# Patient Record
Sex: Male | Born: 1985 | Race: Black or African American | Hispanic: No | Marital: Single | Smoking: Never smoker
Health system: Southern US, Community
[De-identification: ages and names within clinical notes are randomized; demographics above are authoritative.]

## PROBLEM LIST (undated history)

## (undated) DIAGNOSIS — Z87828 Personal history of other (healed) physical injury and trauma: Secondary | ICD-10-CM

---

## 2016-01-08 ENCOUNTER — Emergency Department (HOSPITAL_COMMUNITY): Payer: Self-pay

## 2016-01-08 ENCOUNTER — Emergency Department: Payer: Self-pay

## 2016-01-08 ENCOUNTER — Emergency Department
Admission: EM | Admit: 2016-01-08 | Discharge: 2016-01-08 | Payer: Self-pay | Attending: Emergency Medicine | Admitting: Emergency Medicine

## 2016-01-08 ENCOUNTER — Emergency Department (HOSPITAL_COMMUNITY)
Admission: EM | Admit: 2016-01-08 | Discharge: 2016-01-09 | Disposition: A | Discharge status: Home / Self Care | Payer: Self-pay | Attending: Emergency Medicine | Admitting: Emergency Medicine

## 2016-01-08 ENCOUNTER — Encounter: Payer: Self-pay | Admitting: Emergency Medicine

## 2016-01-08 ENCOUNTER — Encounter (HOSPITAL_COMMUNITY): Payer: Self-pay | Admitting: Emergency Medicine

## 2016-01-08 DIAGNOSIS — Y999 Unspecified external cause status: Secondary | ICD-10-CM | POA: Insufficient documentation

## 2016-01-08 DIAGNOSIS — S91331A Puncture wound without foreign body, right foot, initial encounter: Secondary | ICD-10-CM | POA: Insufficient documentation

## 2016-01-08 DIAGNOSIS — S32501A Unspecified fracture of right pubis, initial encounter for closed fracture: Secondary | ICD-10-CM | POA: Insufficient documentation

## 2016-01-08 DIAGNOSIS — Y939 Activity, unspecified: Secondary | ICD-10-CM | POA: Insufficient documentation

## 2016-01-08 DIAGNOSIS — S80211A Abrasion, right knee, initial encounter: Secondary | ICD-10-CM | POA: Insufficient documentation

## 2016-01-08 DIAGNOSIS — S71101A Unspecified open wound, right thigh, initial encounter: Secondary | ICD-10-CM | POA: Insufficient documentation

## 2016-01-08 DIAGNOSIS — Y929 Unspecified place or not applicable: Secondary | ICD-10-CM | POA: Insufficient documentation

## 2016-01-08 DIAGNOSIS — S60416A Abrasion of right little finger, initial encounter: Secondary | ICD-10-CM | POA: Insufficient documentation

## 2016-01-08 DIAGNOSIS — S91301A Unspecified open wound, right foot, initial encounter: Secondary | ICD-10-CM | POA: Insufficient documentation

## 2016-01-08 DIAGNOSIS — W3400XA Accidental discharge from unspecified firearms or gun, initial encounter: Secondary | ICD-10-CM | POA: Insufficient documentation

## 2016-01-08 DIAGNOSIS — S60311A Abrasion of right thumb, initial encounter: Secondary | ICD-10-CM | POA: Insufficient documentation

## 2016-01-08 DIAGNOSIS — S6991XA Unspecified injury of right wrist, hand and finger(s), initial encounter: Secondary | ICD-10-CM

## 2016-01-08 DIAGNOSIS — Y9302 Activity, running: Secondary | ICD-10-CM | POA: Insufficient documentation

## 2016-01-08 DIAGNOSIS — S32591A Other specified fracture of right pubis, initial encounter for closed fracture: Secondary | ICD-10-CM

## 2016-01-08 DIAGNOSIS — S61304A Unspecified open wound of right ring finger with damage to nail, initial encounter: Secondary | ICD-10-CM | POA: Insufficient documentation

## 2016-01-08 DIAGNOSIS — S32591B Other specified fracture of right pubis, initial encounter for open fracture: Secondary | ICD-10-CM

## 2016-01-08 DIAGNOSIS — S71131A Puncture wound without foreign body, right thigh, initial encounter: Secondary | ICD-10-CM

## 2016-01-08 HISTORY — DX: Personal history of other (healed) physical injury and trauma: Z87.828

## 2016-01-08 LAB — BASIC METABOLIC PANEL
ANION GAP: 9 (ref 5–15)
BUN: 14 mg/dL (ref 6–20)
CALCIUM: 9.2 mg/dL (ref 8.9–10.3)
CO2: 26 mmol/L (ref 22–32)
CREATININE: 1.28 mg/dL — AB (ref 0.61–1.24)
Chloride: 102 mmol/L (ref 101–111)
GLUCOSE: 147 mg/dL — AB (ref 65–99)
Potassium: 3.3 mmol/L — ABNORMAL LOW (ref 3.5–5.1)
Sodium: 137 mmol/L (ref 135–145)

## 2016-01-08 LAB — CBC WITH DIFFERENTIAL/PLATELET
BASOS ABS: 0.1 10*3/uL (ref 0–0.1)
BASOS PCT: 1 %
EOS ABS: 0 10*3/uL (ref 0–0.7)
Eosinophils Relative: 0 %
HEMATOCRIT: 41.3 % (ref 40.0–52.0)
Hemoglobin: 14.1 g/dL (ref 13.0–18.0)
Lymphocytes Relative: 10 %
Lymphs Abs: 2.3 10*3/uL (ref 1.0–3.6)
MCH: 29.7 pg (ref 26.0–34.0)
MCHC: 34.2 g/dL (ref 32.0–36.0)
MCV: 86.9 fL (ref 80.0–100.0)
MONO ABS: 1.5 10*3/uL — AB (ref 0.2–1.0)
MONOS PCT: 7 %
NEUTROS ABS: 17.9 10*3/uL — AB (ref 1.4–6.5)
Neutrophils Relative %: 82 %
PLATELETS: 267 10*3/uL (ref 150–440)
RBC: 4.76 MIL/uL (ref 4.40–5.90)
RDW: 12.2 % (ref 11.5–14.5)
WBC: 21.8 10*3/uL — ABNORMAL HIGH (ref 3.8–10.6)

## 2016-01-08 LAB — TYPE AND SCREEN
ABO/RH(D): O POS
ANTIBODY SCREEN: NEGATIVE

## 2016-01-08 LAB — ABO/RH: ABO/RH(D): O POS

## 2016-01-08 MED ORDER — ONDANSETRON HCL 4 MG/2ML IJ SOLN
4.0000 mg | Freq: Once | INTRAMUSCULAR | Status: AC
  Administered 2016-01-08: 4 mg via INTRAVENOUS
  Filled 2016-01-08: qty 2

## 2016-01-08 MED ORDER — MORPHINE SULFATE (PF) 2 MG/ML IV SOLN
4.0000 mg | Freq: Once | INTRAVENOUS | Status: AC
  Administered 2016-01-08: 4 mg via INTRAVENOUS
  Filled 2016-01-08: qty 2

## 2016-01-08 MED ORDER — MORPHINE SULFATE (PF) 4 MG/ML IV SOLN
4.0000 mg | Freq: Once | INTRAVENOUS | Status: AC
  Administered 2016-01-08: 4 mg via INTRAVENOUS
  Filled 2016-01-08: qty 1

## 2016-01-08 MED ORDER — SODIUM CHLORIDE 0.9 % IV BOLUS (SEPSIS)
1000.0000 mL | Freq: Once | INTRAVENOUS | Status: AC
  Administered 2016-01-08: 1000 mL via INTRAVENOUS

## 2016-01-08 MED ORDER — IOPAMIDOL (ISOVUE-300) INJECTION 61%
100.0000 mL | Freq: Once | INTRAVENOUS | Status: AC | PRN
  Administered 2016-01-08: 100 mL via INTRAVENOUS

## 2016-01-08 MED ORDER — FENTANYL CITRATE (PF) 100 MCG/2ML IJ SOLN
75.0000 ug | Freq: Once | INTRAMUSCULAR | Status: AC
  Administered 2016-01-08: 75 ug via INTRAVENOUS
  Filled 2016-01-08: qty 2

## 2016-01-08 NOTE — ED Notes (Signed)
Patient transported to X-ray 
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
## 2016-01-09 MED ORDER — OXYCODONE-ACETAMINOPHEN 5-325 MG PO TABS
2.0000 | ORAL_TABLET | Freq: Once | ORAL | Status: AC
  Administered 2016-01-09: 2 via ORAL
  Filled 2016-01-09: qty 2

## 2016-01-09 MED ORDER — KETOROLAC TROMETHAMINE 15 MG/ML IJ SOLN
15.0000 mg | Freq: Once | INTRAMUSCULAR | Status: AC
Start: 2016-01-09 — End: 2016-01-09
  Administered 2016-01-09: 15 mg via INTRAVENOUS
  Filled 2016-01-09: qty 1

## 2016-01-09 MED ORDER — TRAMADOL HCL 50 MG PO TABS: 50.0000 mg | ORAL_TABLET | Freq: Four times a day (QID) | ORAL | 0 refills | Status: AC | PRN

## 2016-01-09 MED ORDER — ACETAMINOPHEN 500 MG PO TABS: 500.0000 mg | ORAL_TABLET | Freq: Four times a day (QID) | ORAL | 0 refills | Status: AC | PRN

## 2016-01-09 MED ORDER — OXYCODONE HCL 5 MG PO TABS
5.0000 mg | ORAL_TABLET | Freq: Once | ORAL | Status: AC
  Administered 2016-01-09: 5 mg via ORAL
  Filled 2016-01-09: qty 1

## 2016-01-09 MED ORDER — IBUPROFEN 400 MG PO TABS: 400.0000 mg | ORAL_TABLET | Freq: Four times a day (QID) | ORAL | 0 refills | Status: AC | PRN

## 2016-01-09 MED ORDER — CEPHALEXIN 500 MG PO CAPS: 500.0000 mg | ORAL_CAPSULE | Freq: Two times a day (BID) | ORAL | 0 refills | Status: AC

## 2016-01-09 MED ORDER — OXYCODONE HCL 5 MG PO TABS: 5.0000 mg | ORAL_TABLET | ORAL | 0 refills | Status: AC | PRN

## 2016-01-09 NOTE — Progress Notes (Signed)
Orthopedic Tech Progress Note Patient Details:  Apolinar JunesBrandon Xxxtillman 1985-08-31 [id number]  Ortho Devices Type of Ortho Device: Crutches Ortho Device/Splint Interventions: Ordered, Application   Trinna PostMartinez, Senai Kingsley J 01/09/2016, 2:14 AM
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
